# Patient Record
Sex: Male | Born: 1970 | Race: White | Hispanic: No | Smoking: Light tobacco smoker
Health system: Southern US, Community
[De-identification: ages and names within clinical notes are randomized; demographics above are authoritative.]

## PROBLEM LIST (undated history)

## (undated) DIAGNOSIS — F419 Anxiety disorder, unspecified: Secondary | ICD-10-CM

## (undated) DIAGNOSIS — K649 Unspecified hemorrhoids: Secondary | ICD-10-CM

## (undated) HISTORY — DX: Unspecified hemorrhoids: K64.9

## (undated) HISTORY — DX: Anxiety disorder, unspecified: F41.9

## (undated) HISTORY — PX: OTHER SURGICAL HISTORY: SHX169

---

## 1998-10-15 ENCOUNTER — Emergency Department (HOSPITAL_COMMUNITY): Admission: EM | Admit: 1998-10-15 | Discharge: 1998-10-15 | Payer: Self-pay | Admitting: Emergency Medicine

## 2005-07-20 ENCOUNTER — Emergency Department (HOSPITAL_COMMUNITY): Admission: EM | Admit: 2005-07-20 | Discharge: 2005-07-20 | Payer: Self-pay | Admitting: Emergency Medicine

## 2006-04-20 ENCOUNTER — Encounter
Admission: RE | Admit: 2006-04-20 | Discharge: 2006-07-19 | Payer: Self-pay | Admitting: Physical Medicine & Rehabilitation

## 2006-04-24 ENCOUNTER — Ambulatory Visit: Payer: Self-pay | Admitting: Physical Medicine & Rehabilitation
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
## 2014-01-20 ENCOUNTER — Encounter: Payer: Self-pay | Admitting: Internal Medicine

## 2014-01-20 ENCOUNTER — Ambulatory Visit (INDEPENDENT_AMBULATORY_CARE_PROVIDER_SITE_OTHER): Payer: Self-pay | Admitting: Internal Medicine

## 2014-01-20 VITALS — BP 140/86 | HR 96 | Ht 68.0 in | Wt 159.0 lb

## 2014-01-20 DIAGNOSIS — K648 Other hemorrhoids: Secondary | ICD-10-CM | POA: Insufficient documentation

## 2014-01-20 DIAGNOSIS — F419 Anxiety disorder, unspecified: Secondary | ICD-10-CM | POA: Insufficient documentation

## 2014-01-20 NOTE — Progress Notes (Signed)
   Subjective:    Patient ID: Tommy Simmons, male    DOB: 02/09/1970, 44 y.o.   MRN: [id number]  HPI 44 yo wm with hx of hemorrhoids he says. Has had some bleeding and prolapse sxs in past. He says x years on an intermittent basis. C/o of bulging in the anal area with bending and lifting, sometimes painful. Has used OTC hemorrhoid cream prn with some relief. Would like more definitive Tx. Denies constipation or diarrhea. GI ROS o/w negative. 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   Medications, allergies, past medical history, past surgical history, family history and social history are reviewed and updated in the EMR.  Review of Systems As above, otherwise negative    Objective:   Physical Exam WDWN NAD BP 140/86 mmHg  Pulse 96  Ht 5\' 8"  (1.727 m)  Wt 159 lb (72.122 kg)  BMI 24.18 kg/m2   Anoscopy was performed with the patient in the left lateral decubitus position while a chaperone was present and revealed grade 2 internal hemorrhoids all 3 positions with LL most prominent.  PROCEDURE NOTE: The patient presents with symptomatic grade 2  hemorrhoids, requesting rubber band ligation of his/her hemorrhoidal disease.  All risks, benefits and alternative forms of therapy were described and informed consent was obtained.   The decision was made to band the LL internal hemorrhoid, and the [facility] was used to perform band ligation without complication.  Digital anorectal examination was then performed to assure proper positioning of the band, and to adjust the banded tissue as required.  The patient was discharged home without pain or other issues.  Dietary and behavioral recommendations were given and along with follow-up instructions.      The patient will return as needed for  follow-up and possible additional banding as required. No complications were encountered and the patient tolerated the procedure well.           Assessment & Plan:

## 2014-01-20 NOTE — Patient Instructions (Signed)
HEMORRHOID BANDING PROCEDURE    FOLLOW-UP CARE   1. The procedure you have had should have been relatively painless since the banding of the area involved does not have nerve endings and there is no pain sensation.  The rubber band cuts off the blood supply to the hemorrhoid and the band may fall off as soon as 48 hours after the banding (the band may occasionally be seen in the toilet bowl following a bowel movement). You may notice a temporary feeling of fullness in the rectum which should respond adequately to plain Tylenol or Motrin.  2. Following the banding, avoid strenuous exercise that evening and resume full activity the next day.  A sitz bath (soaking in a warm tub) or bidet is soothing, and can be useful for cleansing the area after bowel movements.     3. To avoid constipation, take two tablespoons of natural wheat bran, natural oat bran, flax, Benefiber or any over the counter fiber supplement and increase your water intake to 7-8 glasses daily.    4. Unless you have been prescribed anorectal medication, do not put anything inside your rectum for two weeks: No suppositories, enemas, fingers, etc.  5. Occasionally, you may have more bleeding than usual after the banding procedure.  This is often from the untreated hemorrhoids rather than the treated one.  Don't be concerned if there is a tablespoon or so of blood.  If there is more blood than this, lie flat with your bottom higher than your head and apply an ice pack to the area. If the bleeding does not stop within a half an hour or if you feel faint, call our office at [phone] or go to the emergency room.  6. Problems are not common; however, if there is a substantial amount of bleeding, severe pain, chills, fever or difficulty passing urine (very rare) or other problems, you should call us at [phone] or report to the nearest emergency room.  7. Do not stay seated continuously for more than 2-3 hours for a day or two  after the procedure.  Tighten your buttock muscles 10-15 times every two hours and take 10-15 deep breaths every 1-2 hours.  Do not spend more than a few minutes on the toilet if you cannot empty your bowel; instead re-visit the toilet at a later time.    Follow up with Dr Gessner as needed.    I appreciate the opportunity to care for you. Carl Gessner, MD, FACG 

## 2014-01-22 NOTE — Assessment & Plan Note (Signed)
LL banded today Have decided to see if this relieves problems and f/u prn for other banding

## 2021-05-09 ENCOUNTER — Emergency Department (HOSPITAL_BASED_OUTPATIENT_CLINIC_OR_DEPARTMENT_OTHER)
Admission: EM | Admit: 2021-05-09 | Discharge: 2021-05-09 | Disposition: A | Discharge status: Home / Self Care | Payer: BC Managed Care – PPO | Attending: Emergency Medicine | Admitting: Emergency Medicine

## 2021-05-09 ENCOUNTER — Emergency Department (HOSPITAL_BASED_OUTPATIENT_CLINIC_OR_DEPARTMENT_OTHER): Payer: BC Managed Care – PPO | Admitting: Radiology

## 2021-05-09 ENCOUNTER — Other Ambulatory Visit (HOSPITAL_BASED_OUTPATIENT_CLINIC_OR_DEPARTMENT_OTHER): Payer: Self-pay

## 2021-05-09 ENCOUNTER — Emergency Department (HOSPITAL_BASED_OUTPATIENT_CLINIC_OR_DEPARTMENT_OTHER): Payer: BC Managed Care – PPO

## 2021-05-09 ENCOUNTER — Other Ambulatory Visit: Payer: Self-pay

## 2021-05-09 ENCOUNTER — Encounter (HOSPITAL_BASED_OUTPATIENT_CLINIC_OR_DEPARTMENT_OTHER): Payer: Self-pay

## 2021-05-09 DIAGNOSIS — S46912A Strain of unspecified muscle, fascia and tendon at shoulder and upper arm level, left arm, initial encounter: Secondary | ICD-10-CM | POA: Insufficient documentation

## 2021-05-09 DIAGNOSIS — M542 Cervicalgia: Secondary | ICD-10-CM | POA: Insufficient documentation

## 2021-05-09 DIAGNOSIS — S40021A Contusion of right upper arm, initial encounter: Secondary | ICD-10-CM | POA: Insufficient documentation

## 2021-05-09 DIAGNOSIS — Y92410 Unspecified street and highway as the place of occurrence of the external cause: Secondary | ICD-10-CM | POA: Insufficient documentation

## 2021-05-09 DIAGNOSIS — S8001XA Contusion of right knee, initial encounter: Secondary | ICD-10-CM | POA: Diagnosis not present

## 2021-05-09 DIAGNOSIS — R519 Headache, unspecified: Secondary | ICD-10-CM | POA: Insufficient documentation

## 2021-05-09 DIAGNOSIS — M545 Low back pain, unspecified: Secondary | ICD-10-CM | POA: Diagnosis not present

## 2021-05-09 DIAGNOSIS — S4992XA Unspecified injury of left shoulder and upper arm, initial encounter: Secondary | ICD-10-CM | POA: Diagnosis present

## 2021-05-09 DIAGNOSIS — T07XXXA Unspecified multiple injuries, initial encounter: Secondary | ICD-10-CM

## 2021-05-09 MED ORDER — HYDROCODONE-ACETAMINOPHEN 5-325 MG PO TABS
1.0000 | ORAL_TABLET | Freq: Once | ORAL | Status: AC
  Administered 2021-05-09: 1 via ORAL
  Filled 2021-05-09: qty 1

## 2021-05-09 MED ORDER — IBUPROFEN 600 MG PO TABS
600.0000 mg | ORAL_TABLET | Freq: Four times a day (QID) | ORAL | 0 refills | Status: AC | PRN
  Filled 2021-05-09: qty 20, 5d supply, fill #0

## 2021-05-09 MED ORDER — NAPROXEN 250 MG PO TABS
500.0000 mg | ORAL_TABLET | Freq: Once | ORAL | Status: AC
  Administered 2021-05-09: 500 mg via ORAL
  Filled 2021-05-09: qty 2

## 2021-05-09 MED ORDER — METHOCARBAMOL 500 MG PO TABS
500.0000 mg | ORAL_TABLET | Freq: Two times a day (BID) | ORAL | 0 refills | Status: DC
  Filled 2021-05-09: qty 10, 5d supply, fill #0
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
## 2021-05-09 NOTE — ED Notes (Signed)
Patient transported to Radiology 

## 2021-05-09 NOTE — ED Notes (Signed)
MD at the Bedside. 

## 2021-05-09 NOTE — ED Notes (Signed)
RN provided AVS using Teachback Method. Patient verbalizes understanding of Discharge Instructions. Opportunity for Questioning and Answers were provided by RN. Patient Discharged from ED ambulatory to Home with Family. ? ?
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
## 2021-08-01 ENCOUNTER — Other Ambulatory Visit (HOSPITAL_BASED_OUTPATIENT_CLINIC_OR_DEPARTMENT_OTHER): Payer: Self-pay

## 2021-08-01 ENCOUNTER — Emergency Department (HOSPITAL_BASED_OUTPATIENT_CLINIC_OR_DEPARTMENT_OTHER)
Admission: EM | Admit: 2021-08-01 | Discharge: 2021-08-01 | Disposition: A | Discharge status: Home / Self Care | Payer: BC Managed Care – PPO | Attending: Emergency Medicine | Admitting: Emergency Medicine

## 2021-08-01 ENCOUNTER — Emergency Department (HOSPITAL_BASED_OUTPATIENT_CLINIC_OR_DEPARTMENT_OTHER): Payer: BC Managed Care – PPO | Admitting: Radiology

## 2021-08-01 ENCOUNTER — Other Ambulatory Visit: Payer: Self-pay

## 2021-08-01 DIAGNOSIS — R030 Elevated blood-pressure reading, without diagnosis of hypertension: Secondary | ICD-10-CM | POA: Diagnosis not present

## 2021-08-01 DIAGNOSIS — M546 Pain in thoracic spine: Secondary | ICD-10-CM | POA: Diagnosis not present

## 2021-08-01 DIAGNOSIS — M25512 Pain in left shoulder: Secondary | ICD-10-CM | POA: Insufficient documentation

## 2021-08-01 MED ORDER — IBUPROFEN 800 MG PO TABS
800.0000 mg | ORAL_TABLET | Freq: Once | ORAL | Status: AC
  Administered 2021-08-01: 800 mg via ORAL
  Filled 2021-08-01: qty 1

## 2021-08-01 MED ORDER — METHYLPREDNISOLONE 4 MG PO TBPK
ORAL_TABLET | ORAL | 0 refills | Status: AC
  Filled 2021-08-01: qty 21, 6d supply, fill #0

## 2021-08-01 MED ORDER — METHOCARBAMOL 500 MG PO TABS
500.0000 mg | ORAL_TABLET | Freq: Three times a day (TID) | ORAL | 0 refills | Status: AC | PRN
  Filled 2021-08-01: qty 20, 7d supply, fill #0

## 2021-08-01 NOTE — ED Notes (Signed)
Discharge instructions, follow up care, and prescriptions reviewed and explained, pt verbalized understanding. Pt caox4 and ambulatory on departure.  

## 2021-08-01 NOTE — Discharge Instructions (Addendum)
Your x-rays negative for fracture or dislocation.  Your blood pressure today is elevated.  Establish care with a primary doctor and keep a record of your blood pressure because you may need to take medication for it.  Take the anti-inflammatories and steroids as prescribed and follow-up with your orthopedic doctor as scheduled.  Return to the ED with weakness, numbness, tingling, chest pain, shortness of breath or any other concerns.

## 2021-08-01 NOTE — ED Provider Notes (Signed)
[facility] EMERGENCY DEPT Provider Note   CSN: [id number] Arrival date & time: 08/01/21  0935     History  Chief Complaint  Patient presents with   Shoulder Pain    Tommy Simmons is a 51 y.o. male.  Patient with no medical history presenting with left shoulder and upper back pain for the past 2 weeks.  Denies any fall or trauma.  States the pain starts in his left posterior upper back and shoulder area and radiates to the mid upper arm.  Takes ibuprofen with some relief but it never goes away completely.  Feels subjective weakness in the arm.  No numbness or tingling.  No chest pain or shortness of breath.  No fever.  No abdominal pain, nausea or vomiting.  No previous neck or back problems.  He works at [employer] and does a lot of lifting.  States he is left-handed and the pain never goes really goes away completely but is always somewhat there. States he is here today for an x-ray No chest pain or SOB.   The history is provided by the patient.  Shoulder Pain      Home Medications Prior to Admission medications   Medication Sig Start Date End Date Taking? Authorizing Provider  ibuprofen (ADVIL) 600 MG tablet Take 1 tablet (600 mg total) by mouth every 6 (six) hours as needed. 05/09/21   Derwood Kaplan, MD  methocarbamol (ROBAXIN) 500 MG tablet Take 1 tablet (500 mg total) by mouth 2 (two) times daily. 05/09/21   Derwood Kaplan, MD  naproxen sodium (ANAPROX) 220 MG tablet Take 220 mg by mouth 2 (two) times daily with a meal.    [provider]      Allergies    Dilaudid [hydromorphone]    Review of Systems   Review of Systems  Musculoskeletal:  Positive for arthralgias and myalgias.   all other systems are negative except as noted in the HPI and PMH.    Physical Exam Updated Vital Signs BP (!) 193/90   Pulse 82   Temp 97.9 F (36.6 C) (Oral)   Resp 20   SpO2 98%  Physical Exam Vitals and nursing note reviewed.  Constitutional:       General: He is not in acute distress.    Appearance: He is well-developed.  HENT:     Head: Normocephalic and atraumatic.     Mouth/Throat:     Pharynx: No oropharyngeal exudate.  Eyes:     Conjunctiva/sclera: Conjunctivae normal.     Pupils: Pupils are equal, round, and reactive to light.  Neck:     Comments: Left paraspinal and upper back pain across trapezius.  No midline tenderness Cardiovascular:     Rate and Rhythm: Normal rate and regular rhythm.     Heart sounds: Normal heart sounds. No murmur heard. Pulmonary:     Effort: Pulmonary effort is normal. No respiratory distress.     Breath sounds: Normal breath sounds.  Abdominal:     Palpations: Abdomen is soft.     Tenderness: There is no abdominal tenderness. There is no guarding or rebound.  Musculoskeletal:        General: Tenderness present. Normal range of motion.     Cervical back: Normal range of motion and neck supple.     Comments: Tenderness to left trapezius, rhomboid and paraspinal cervical muscles. Intact radial pulses and grip strengths.  5/5 strength throughout Pain worse with movement of arm.   Skin:  General: Skin is warm.  Neurological:     Mental Status: He is alert and oriented to person, place, and time.     Cranial Nerves: No cranial nerve deficit.     Motor: No abnormal muscle tone.     Coordination: Coordination normal.     Comments:  5/5 strength throughout. CN 2-12 intact.Equal grip strength.   Psychiatric:        Behavior: Behavior normal.     ED Results / Procedures / Treatments   Labs (all labs ordered are listed, but only abnormal results are displayed) Labs Reviewed - No data to display  EKG EKG Interpretation  Date/Time:  Monday August 01 2021 10:10:14 EDT Ventricular Rate:  74 PR Interval:  152 QRS Duration: 89 QT Interval:  374 QTC Calculation: 415 R Axis:   83 Text Interpretation: Sinus rhythm No previous ECGs available Confirmed by Glynn Octave [phone]) on  08/01/2021 10:16:55 AM  Radiology DG Cervical Spine Complete  Result Date: 08/01/2021 CLINICAL DATA:  Shoulder pain. EXAM: CERVICAL SPINE - COMPLETE 4+ VIEW COMPARISON:  05/09/2021 FINDINGS: Normal alignment of the cervical spine. The vertebral body heights are well preserved. Mild multilevel degenerative disc disease is noted C3-4, C4-5, C5-6 and C6-7. This is most pronounced at the C5-6 level. Bilateral neural foraminal narrowing is apparent at C3-4 C4-5, C5-6 and C6-7. The prevertebral soft tissue space appears normal. IMPRESSION: Cervical degenerative disc disease with bilateral neural foraminal narrowing. Electronically Signed   By: Signa Kell M.D.   On: 08/01/2021 11:02   DG Shoulder Left  Result Date: 08/01/2021 CLINICAL DATA:  Left shoulder pain for 2 weeks. EXAM: LEFT SHOULDER - 2+ VIEW COMPARISON:  Left shoulder radiographs 05/09/2021 FINDINGS: Normal bone mineralization. Normal alignment. No acute fracture or dislocation. Unchanged calcified likely benign granuloma overlying the superolateral left upper lung. Mild vascular calcifications within the aortic arch. IMPRESSION: Normal left shoulder radiographs. Electronically Signed   By: Neita Garnet M.D.   On: 08/01/2021 10:57    Procedures Procedures    Medications Ordered in ED Medications  ibuprofen (ADVIL) tablet 800 mg (has no administration in time range)    ED Course/ Medical Decision Making/ A&P                           Medical Decision Making Amount and/or Complexity of Data Reviewed Labs: ordered. Decision-making details documented in ED Course. Radiology: ordered and independent interpretation performed. Decision-making details documented in ED Course. ECG/medicine tests: ordered and independent interpretation performed. Decision-making details documented in ED Course.  Risk Prescription drug management.  2 weeks of left upper back and shoulder pain.  Equal upper extremity pulses and grip strength.  Low  suspicion for ACS.  EKG is sinus rhythm without acute ST changes  X-rays negative for acute traumatic pathology.  No evidence of obvious foraminal stenosis.  Intact strength, pulses, sensation and reflexes.  Blood pressure is elevated.  Patient informed of same and need for follow-up with PCP. States he is under a lot of stress.  Patient treated supportively with anti-inflammatories and steroid course.  Follow-up with PCP for further evaluation of shoulder pain and possible need for MRI of C-spine. He is aware of his elevated blood pressure and need for follow-up.  Low suspicion for endorgan damage today.  Return to the ED with chest pain, shortness of breath, weakness in the arm, numbness, tingling, other concerns.         Final Clinical Impression(s) /  ED Diagnoses Final diagnoses:  Acute pain of left shoulder  Elevated blood pressure reading    Rx / DC Orders ED Discharge Orders     None         Vieva Brummitt, Annie Main, MD 08/01/21 1558

## 2021-08-01 NOTE — ED Triage Notes (Signed)
Pt here from home with c/o left shoulder pain for 2 weeks , ibuprofen will help , no trauma  noted

## 2021-08-22 ENCOUNTER — Encounter: Payer: Self-pay | Admitting: Nurse Practitioner

## 2021-08-23 NOTE — Progress Notes (Signed)
No show for appt. 

## 2021-10-25 ENCOUNTER — Other Ambulatory Visit (HOSPITAL_BASED_OUTPATIENT_CLINIC_OR_DEPARTMENT_OTHER): Payer: Self-pay

## 2021-10-25 MED ORDER — AMOXICILLIN 500 MG PO CAPS
500.0000 mg | ORAL_CAPSULE | Freq: Three times a day (TID) | ORAL | 0 refills | Status: DC
  Filled 2021-10-25: qty 12, 4d supply, fill #0

## 2021-10-31 ENCOUNTER — Other Ambulatory Visit (HOSPITAL_BASED_OUTPATIENT_CLINIC_OR_DEPARTMENT_OTHER): Payer: Self-pay

## 2021-10-31 MED ORDER — AMOXICILLIN 500 MG PO CAPS
ORAL_CAPSULE | ORAL | 1 refills | Status: AC
  Filled 2021-10-31: qty 21, 7d supply, fill #0

## 2022-04-14 ENCOUNTER — Other Ambulatory Visit (HOSPITAL_BASED_OUTPATIENT_CLINIC_OR_DEPARTMENT_OTHER): Payer: Self-pay

## 2022-04-14 MED ORDER — AMOXICILLIN 500 MG PO CAPS
500.0000 mg | ORAL_CAPSULE | Freq: Two times a day (BID) | ORAL | 0 refills | Status: AC
  Filled 2022-04-14: qty 12, 6d supply, fill #0

## 2022-04-18 ENCOUNTER — Other Ambulatory Visit (HOSPITAL_BASED_OUTPATIENT_CLINIC_OR_DEPARTMENT_OTHER): Payer: Self-pay

## 2022-04-24 ENCOUNTER — Other Ambulatory Visit (HOSPITAL_BASED_OUTPATIENT_CLINIC_OR_DEPARTMENT_OTHER): Payer: Self-pay
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
